# Patient Record
Sex: Male | Born: 1965 | Hispanic: Yes | Marital: Married | Smoking: Never smoker
Health system: Southern US, Community
[De-identification: ages and names within clinical notes are randomized; demographics above are authoritative.]

## PROBLEM LIST (undated history)

## (undated) DIAGNOSIS — E119 Type 2 diabetes mellitus without complications: Secondary | ICD-10-CM

## (undated) DIAGNOSIS — I10 Essential (primary) hypertension: Secondary | ICD-10-CM

## (undated) HISTORY — PX: VASCULAR SURGERY: SHX849

## (undated) HISTORY — PX: CHOLECYSTECTOMY: SHX55

---

## 2020-03-13 ENCOUNTER — Encounter (HOSPITAL_BASED_OUTPATIENT_CLINIC_OR_DEPARTMENT_OTHER): Payer: Self-pay

## 2020-03-13 ENCOUNTER — Other Ambulatory Visit: Payer: Self-pay

## 2020-03-13 ENCOUNTER — Emergency Department (HOSPITAL_BASED_OUTPATIENT_CLINIC_OR_DEPARTMENT_OTHER): Payer: 59

## 2020-03-13 ENCOUNTER — Telehealth (HOSPITAL_BASED_OUTPATIENT_CLINIC_OR_DEPARTMENT_OTHER): Payer: Self-pay | Admitting: Physician Assistant

## 2020-03-13 ENCOUNTER — Emergency Department (HOSPITAL_BASED_OUTPATIENT_CLINIC_OR_DEPARTMENT_OTHER)
Admission: EM | Admit: 2020-03-13 | Discharge: 2020-03-13 | Disposition: A | Discharge status: Home / Self Care | Payer: 59 | Attending: Emergency Medicine | Admitting: Emergency Medicine

## 2020-03-13 DIAGNOSIS — E119 Type 2 diabetes mellitus without complications: Secondary | ICD-10-CM | POA: Diagnosis not present

## 2020-03-13 DIAGNOSIS — R1031 Right lower quadrant pain: Secondary | ICD-10-CM | POA: Diagnosis present

## 2020-03-13 DIAGNOSIS — I10 Essential (primary) hypertension: Secondary | ICD-10-CM | POA: Insufficient documentation

## 2020-03-13 DIAGNOSIS — N201 Calculus of ureter: Secondary | ICD-10-CM

## 2020-03-13 HISTORY — DX: Essential (primary) hypertension: I10

## 2020-03-13 HISTORY — DX: Type 2 diabetes mellitus without complications: E11.9

## 2020-03-13 LAB — COMPREHENSIVE METABOLIC PANEL
ALT: 56 U/L — ABNORMAL HIGH (ref 0–44)
AST: 39 U/L (ref 15–41)
Albumin: 4.4 g/dL (ref 3.5–5.0)
Alkaline Phosphatase: 62 U/L (ref 38–126)
Anion gap: 11 (ref 5–15)
BUN: 21 mg/dL — ABNORMAL HIGH (ref 6–20)
CO2: 24 mmol/L (ref 22–32)
Calcium: 9.4 mg/dL (ref 8.9–10.3)
Chloride: 104 mmol/L (ref 98–111)
Creatinine, Ser: 0.98 mg/dL (ref 0.61–1.24)
GFR calc Af Amer: >60 mL/min (ref >60)
GFR calc non Af Amer: >60 mL/min (ref >60)
Glucose, Bld: 143 mg/dL — ABNORMAL HIGH (ref 70–99)
Potassium: 3.9 mmol/L (ref 3.5–5.1)
Sodium: 139 mmol/L (ref 135–145)
Total Bilirubin: 0.7 mg/dL (ref 0.3–1.2)
Total Protein: 7.7 g/dL (ref 6.5–8.1)

## 2020-03-13 LAB — CBC WITH DIFFERENTIAL/PLATELET
Abs Immature Granulocytes: 0.05 10*3/uL (ref 0.00–0.07)
Basophils Absolute: 0.1 10*3/uL (ref 0.0–0.1)
Basophils Relative: 1 %
Eosinophils Absolute: 0 10*3/uL (ref 0.0–0.5)
Eosinophils Relative: 0 %
HCT: 51.4 % (ref 39.0–52.0)
Hemoglobin: 17 g/dL (ref 13.0–17.0)
Immature Granulocytes: 0 %
Lymphocytes Relative: 5 %
Lymphs Abs: 0.7 10*3/uL (ref 0.7–4.0)
MCH: 32 pg (ref 26.0–34.0)
MCHC: 33.1 g/dL (ref 30.0–36.0)
MCV: 96.6 fL (ref 80.0–100.0)
Monocytes Absolute: 0.5 10*3/uL (ref 0.1–1.0)
Monocytes Relative: 3 %
Neutro Abs: 13.2 10*3/uL — ABNORMAL HIGH (ref 1.7–7.7)
Neutrophils Relative %: 91 %
Platelets: 195 10*3/uL (ref 150–400)
RBC: 5.32 MIL/uL (ref 4.22–5.81)
RDW: 12.9 % (ref 11.5–15.5)
WBC: 14.5 10*3/uL — ABNORMAL HIGH (ref 4.0–10.5)
nRBC: 0 % (ref 0.0–0.2)

## 2020-03-13 LAB — URINALYSIS, MICROSCOPIC (REFLEX)

## 2020-03-13 LAB — URINALYSIS, ROUTINE W REFLEX MICROSCOPIC
Bilirubin Urine: NEGATIVE
Glucose, UA: NEGATIVE mg/dL
Ketones, ur: NEGATIVE mg/dL
Leukocytes,Ua: NEGATIVE
Nitrite: NEGATIVE
Protein, ur: NEGATIVE mg/dL
Specific Gravity, Urine: 1.02 (ref 1.005–1.030)
pH: 6 (ref 5.0–8.0)

## 2020-03-13 LAB — CBG MONITORING, ED: Glucose-Capillary: 131 mg/dL — ABNORMAL HIGH (ref 70–99)

## 2020-03-13 LAB — LIPASE, BLOOD: Lipase: 22 U/L (ref 11–51)

## 2020-03-13 MED ORDER — TAMSULOSIN HCL 0.4 MG PO CAPS: 0.4000 mg | ORAL_CAPSULE | Freq: Every day | ORAL | 0 refills | Status: DC

## 2020-03-13 MED ORDER — ONDANSETRON HCL 4 MG PO TABS
4.0000 mg | ORAL_TABLET | Freq: Four times a day (QID) | ORAL | 0 refills | Status: DC
Start: 2020-03-13 — End: 2020-03-13

## 2020-03-13 MED ORDER — HYDROCODONE-ACETAMINOPHEN 5-325 MG PO TABS: 1.0000 | ORAL_TABLET | ORAL | 0 refills | Status: DC | PRN

## 2020-03-13 MED ORDER — ONDANSETRON HCL 4 MG PO TABS: 4.0000 mg | ORAL_TABLET | Freq: Four times a day (QID) | ORAL | 0 refills | Status: AC

## 2020-03-13 MED ORDER — ONDANSETRON HCL 4 MG/2ML IJ SOLN
4.0000 mg | Freq: Once | INTRAMUSCULAR | Status: AC
  Administered 2020-03-13: 4 mg via INTRAVENOUS
  Filled 2020-03-13: qty 2

## 2020-03-13 MED ORDER — SODIUM CHLORIDE 0.9 % IV BOLUS
1000.0000 mL | Freq: Once | INTRAVENOUS | Status: AC
  Administered 2020-03-13: 1000 mL via INTRAVENOUS

## 2020-03-13 NOTE — ED Notes (Signed)
ED Provider at bedside. 

## 2020-03-13 NOTE — ED Provider Notes (Signed)
[facility] EMERGENCY DEPARTMENT Provider Note   CSN: [id number] Arrival date & time: 03/13/20  1015     History Chief Complaint  Patient presents with  . Abdominal Pain    Shannon Hampton is a 54 y.o. male.  HPI 54 year old male w/ history of pretension and DM type II presents to the ER for fairly sudden onset of right lower quadrant pain around 3:30 AM this morning.  Pain has been gradually increasing and he has had consistent nausea and several episodes of nonbloody nonbilious vomiting.  He denies any fevers but does endorse subjective chills.  He had normal bowel movements, last bowel movement was yesterday.  He does state that he has a history of constipation approximately 3 months ago but this is cleared with some medications that he was given from his primary care doctor.  He denies any dysuria or blood in his urine.  No chest pain or shortness of breath.  No headache.  No back pain.  He was seen in urgent care and was given Toradol, which brought his pain from a 10 down to a 4.  He was sent here for rule out of appendicitis or kidney stones.  He denies any chest pain or shortness of breath.  History of kidney stones, appendix present.    Past Medical History:  Diagnosis Date  . Diabetes mellitus without complication (HCC)   . Hypertension     There are no problems to display for this patient.   Past Surgical History:  Procedure Laterality Date  . CHOLECYSTECTOMY    . VASCULAR SURGERY         No family history on file.  Social History   Tobacco Use  . Smoking status: Never Smoker  . Smokeless tobacco: Never Used  Substance Use Topics  . Alcohol use: Never  . Drug use: Never    Home Medications Prior to Admission medications   Medication Sig Start Date End Date Taking? Authorizing Provider  HYDROcodone-acetaminophen (NORCO/VICODIN) 5-325 MG tablet Take 1 tablet by mouth every 4 (four) hours as needed for up to 4 days. 03/13/20 03/17/20  Mare Ferrari, PA-C  ondansetron (ZOFRAN) 4 MG tablet Take 1 tablet (4 mg total) by mouth every 6 (six) hours. 03/13/20   Mare Ferrari, PA-C  tamsulosin (FLOMAX) 0.4 MG CAPS capsule Take 1 capsule (0.4 mg total) by mouth daily. 03/13/20   Mare Ferrari, PA-C    Allergies    Patient has no known allergies.  Review of Systems   Review of Systems  Constitutional: Positive for chills. Negative for fever.  HENT: Negative for ear pain and sore throat.   Eyes: Negative for pain and visual disturbance.  Respiratory: Negative for cough and shortness of breath.   Cardiovascular: Negative for chest pain and palpitations.  Gastrointestinal: Positive for abdominal pain, nausea and vomiting.  Genitourinary: Negative for dysuria and hematuria.  Musculoskeletal: Negative for arthralgias and back pain.  Skin: Negative for color change and rash.  Neurological: Negative for dizziness, seizures, syncope and headaches.  Psychiatric/Behavioral: Negative for confusion.  All other systems reviewed and are negative.   Physical Exam Updated Vital Signs BP (!) 153/100 (BP Location: Right Arm)   Pulse 66   Temp (!) 97.5 F (36.4 C) (Oral)   Resp 18   Ht 5\' 8"  (1.727 m)   Wt 90.7 kg   SpO2 97%   BMI 30.41 kg/m   Physical Exam Vitals and nursing note reviewed.  Constitutional:  General: He is not in acute distress.    Appearance: He is well-developed. He is not ill-appearing or diaphoretic.  HENT:     Head: Normocephalic and atraumatic.  Eyes:     Conjunctiva/sclera: Conjunctivae normal.  Cardiovascular:     Rate and Rhythm: Normal rate and regular rhythm.     Heart sounds: Normal heart sounds. No murmur heard.   Pulmonary:     Effort: Pulmonary effort is normal. No respiratory distress.     Breath sounds: Normal breath sounds.  Abdominal:     Palpations: Abdomen is soft.     Tenderness: There is abdominal tenderness in the right lower quadrant. There is right CVA tenderness. There is no  left CVA tenderness or rebound. Positive signs include McBurney's sign. Negative signs include Murphy's sign.  Musculoskeletal:     Cervical back: Neck supple.  Skin:    General: Skin is warm and dry.     Coloration: Skin is not jaundiced or mottled.  Neurological:     General: No focal deficit present.     Mental Status: He is alert.  Psychiatric:        Mood and Affect: Mood normal.        Behavior: Behavior normal.     ED Results / Procedures / Treatments   Labs (all labs ordered are listed, but only abnormal results are displayed) Labs Reviewed  CBC WITH DIFFERENTIAL/PLATELET - Abnormal; Notable for the following components:      Result Value   WBC 14.5 (*)    Neutro Abs 13.2 (*)    All other components within normal limits  URINALYSIS, ROUTINE W REFLEX MICROSCOPIC - Abnormal; Notable for the following components:   Hgb urine dipstick LARGE (*)    All other components within normal limits  COMPREHENSIVE METABOLIC PANEL - Abnormal; Notable for the following components:   Glucose, Bld 143 (*)    BUN 21 (*)    ALT 56 (*)    All other components within normal limits  URINALYSIS, MICROSCOPIC (REFLEX) - Abnormal; Notable for the following components:   Bacteria, UA MANY (*)    All other components within normal limits  CBG MONITORING, ED - Abnormal; Notable for the following components:   Glucose-Capillary 131 (*)    All other components within normal limits  URINE CULTURE  LIPASE, BLOOD    EKG EKG Interpretation  Date/Time:  Friday March 13 2020 10:53:42 EDT Ventricular Rate:  62 PR Interval:    QRS Duration: 102 QT Interval:  395 QTC Calculation: 402 R Axis:   96 Text Interpretation: Sinus rhythm Borderline right axis deviation ST elev, probable normal early repol pattern No previous ECGs available Confirmed by Frederick Peers [phone]) on 03/13/2020 10:56:59 AM   Radiology CT Renal Stone Study  Result Date: 03/13/2020 CLINICAL DATA:  Right flank pain and right  lower quadrant pain with nausea and vomiting EXAM: CT ABDOMEN AND PELVIS WITHOUT CONTRAST TECHNIQUE: Multidetector CT imaging of the abdomen and pelvis was performed following the standard protocol without IV contrast. COMPARISON:  None. FINDINGS: Lower chest: Bibasilar subsegmental atelectasis. Heart size is normal. Hepatobiliary: No focal liver abnormality is seen. Status post cholecystectomy. No biliary dilatation. Pancreas: Punctate parenchymal calcifications suggesting sequela of chronic pancreatitis. No pancreatic ductal dilatation or surrounding inflammatory changes. No peripancreatic fluid collection. Spleen: Normal in size without focal abnormality. Adrenals/Urinary Tract: Unremarkable adrenal glands. Numerous coarse calcifications within the right kidney measuring between 2-4 mm. 4 mm stone within the mid right ureter resulting  in mild right hydronephrosis (series 2, image 55) there are numerous coarse calcifications within the left kidney, the largest measuring up to 7 mm. No left-sided hydronephrosis. Left ureter is unremarkable. Urinary bladder is unremarkable. Stomach/Bowel: Stomach is within normal limits. Appendix appears normal (series 2, image 58). Scattered colonic diverticulosis. No evidence of bowel wall thickening, distention, or inflammatory changes. Moderate volume of stool within the colon. Vascular/Lymphatic: No significant vascular findings are present. No enlarged abdominal or pelvic lymph nodes. Reproductive: Prostate is unremarkable. Other: Small fat containing umbilical hernia. No free air, free fluid, or fluid collection. Musculoskeletal: 9 mm rounded area of sclerosis in the posterior left acetabulum (series 2, image 84) nonspecific and may reflect a bone island. No additional lytic or sclerotic bone lesions. No acute osseous abnormality. 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 IMPRESSION: 1. There is a 4 mm stone within the mid right  ureter resulting in mild right hydronephrosis. 2. Multiple bilateral nonobstructing renal calculi. 3. Colonic diverticulosis without evidence of acute diverticulitis. 4. Small fat containing umbilical hernia. 5. Grade 1 anterolisthesis of L5 on S1 secondary to chronic appearing bilateral L5 pars defects. 6. Incidental note of a 9 mm rounded area of sclerosis in the posterior left acetabulum, nonspecific. In the absence of known malignancy, this finding is favored to reflect a bone island. Electronically Signed   By: Davina Poke D.O.   On: 03/13/2020 13:24    Procedures Procedures (including critical care time)  Medications Ordered in ED Medications  ondansetron (ZOFRAN) injection 4 mg (4 mg Intravenous Given 03/13/20 1105)  sodium chloride 0.9 % bolus 1,000 mL (0 mLs Intravenous Stopped 03/13/20 1216)    ED Course  I have reviewed the triage vital signs and the nursing notes.  Pertinent labs & imaging results that were available during my care of the patient were reviewed by me and considered in my medical decision making (see chart for details).    MDM Rules/Calculators/A&P                         54 year old male with right lower quadrant pain which began earlier this morning around 3:30 AM On presentation, the patient is alert and oriented, nontoxic-appearing, no acute distress.  He is resting comfortably in the ER bed without evidence of increased work of breathing.  Physical exam with right lower quadrant tenderness and right flank tenderness.  No other pertinent physical exam findings.  Patient is afebrile here in the ED, slightly hypertensive with a blood pressure 153/100, but no signs of hypertensive urgency/emergency.  Suspect this is likely due to pain.  DDx includes kidney stones, appendicitis, diverticulitis, bowel obstruction, diverticulitis, inguinal hernia  CBC with leukocytosis of 14.2, UA, CMP and lipase pending.  CBG pending.  Will evaluate UA and determine appropriate  imaging.  Will give IV fluids and Zofran.  Patient received Toradol in the ER and notes improvement in his pain.  Will provide pain medication if pain returns again.   CMP with mildly increased BUN of 21, normal creatinine.  Normal electrolytes, ALT mildly elevated at 56.  No other values to compare this with.  UA with large hemoglobin, many bacteria.  Patient denies any pain or burning with urination, will send off culture but doubt UTI.  CBC with leukocytosis of 14.5.  CBG 131 today.  UA renal study with right ureteral stone causing mild hydronephrosis.  A rounded area of sclerosis was noted  on the left acetabulum, patient does not have a history of cancer so doubt malignancy.  I did mention this to the patient and encouraged him to follow-up with his PCP about this finding.  Strict return precautions given, 9 including worsening fever, chills, nausea, vomiting, uncontrolled pain.  Follow-up for urology provided.  Zofran, Flomax, strainer and pain medication provided.  All the patient's questions have been answered, he voices understanding and is agreeable to the plan.  Discussed the case with Dr. Clarene Duke who is agreeable to the above plan and disposition.   Final Clinical Impression(s) / ED Diagnoses Final diagnoses:  Right ureteral stone    Rx / DC Orders ED Discharge Orders         Ordered    ondansetron (ZOFRAN) 4 MG tablet  Every 6 hours,   Status:  Discontinued     Reprint     03/13/20 1346    tamsulosin (FLOMAX) 0.4 MG CAPS capsule  Daily,   Status:  Discontinued     Reprint     03/13/20 1346    HYDROcodone-acetaminophen (NORCO/VICODIN) 5-325 MG tablet  Every 4 hours PRN,   Status:  Discontinued     Reprint     03/13/20 1346    HYDROcodone-acetaminophen (NORCO/VICODIN) 5-325 MG tablet  Every 4 hours PRN     Discontinue  Reprint     03/13/20 1351    ondansetron (ZOFRAN) 4 MG tablet  Every 6 hours     Discontinue  Reprint     03/13/20 1351    tamsulosin (FLOMAX) 0.4 MG CAPS capsule   Daily     Discontinue  Reprint     03/13/20 1351           Mare Ferrari, PA-C 03/13/20 1359    Little, Ambrose Finland, MD 03/14/20 1657

## 2020-03-13 NOTE — Discharge Instructions (Addendum)
Your scan today showed a kidney stone in your right ureter.  You also have some stones in the kidney as well.  There was also a spot noted on the bone in your  left hip, please follow-up with your primary care doctor about this.  I have provided some pain medication, nausea medication and a medication called Flomax which will help with getting the stone out.  Please return to the ER if you have fevers, chills, worsening pain, or any other concerning symptoms.  Please call the urology office provided to schedule an appointment for follow-up.

## 2020-03-13 NOTE — Telephone Encounter (Cosign Needed)
Vicodin/Zofran/Flomax did not go through to pharmacy. Will re-order.

## 2020-03-13 NOTE — ED Notes (Signed)
Pt reports he is taking medications for BP, DM, and HLD, unsure of names of medications.

## 2020-03-13 NOTE — ED Triage Notes (Addendum)
Pt arrives with c/o abdominal pain starting around 330 this morning. Pt was seen at [facility] and given Toradol injection. Pt reports this brought his pain from a 10 to a 4. Pain is sharp, c/o nausea around 6 am. Pt points to right flank and RLQ when asked where he is having pain. Denies urinary symptoms.

## 2020-03-13 NOTE — ED Notes (Signed)
Taken to CT.

## 2020-03-14 ENCOUNTER — Telehealth: Payer: Self-pay

## 2020-03-14 LAB — URINE CULTURE: Culture: NO GROWTH

## 2020-03-14 MED ORDER — ONDANSETRON 4 MG PO TBDP
4.0000 mg | ORAL_TABLET | Freq: Three times a day (TID) | ORAL | 0 refills | Status: AC | PRN
Start: 2020-03-14 — End: ?

## 2020-03-14 MED ORDER — HYDROCODONE-ACETAMINOPHEN 5-325 MG PO TABS: 2.0000 | ORAL_TABLET | ORAL | 0 refills | Status: AC | PRN

## 2020-03-14 MED ORDER — TAMSULOSIN HCL 0.4 MG PO CAPS: 0.4000 mg | ORAL_CAPSULE | Freq: Every day | ORAL | 0 refills | Status: AC

## 2020-03-14 NOTE — Telephone Encounter (Signed)
Prescriptions not received by pharmacy. I resent original medications.

## 2020-03-14 NOTE — Telephone Encounter (Signed)
Narcotic orders for [facility] can only go up to 10 ills per day unless he was a inpatient. The order reads up to 12 pills a day. Messaged Dr Clarene Duke who oked 10 pills a day 2 tabs every 4 hours with a cap of 10 pills a day.  Conveyed this to pharmacist.

## 2020-12-10 IMAGING — CT CT RENAL STONE PROTOCOL
2 of 4 series · 16 of 46 positions shown, 18 images · non-contrast
Comparison: None.

CLINICAL DATA: Right flank pain and right lower quadrant pain with
nausea and vomiting

EXAM:
CT ABDOMEN AND PELVIS WITHOUT CONTRAST
TECHNIQUE: Multidetector CT imaging of the abdomen and pelvis was performed
following the standard protocol without IV contrast.

[Series 2: axial st · axial · 0.91mm/px · z∈[-512,-37]mm · 13 of 105 slices shown, 15 images]
[im 5/105  soft-tissue]
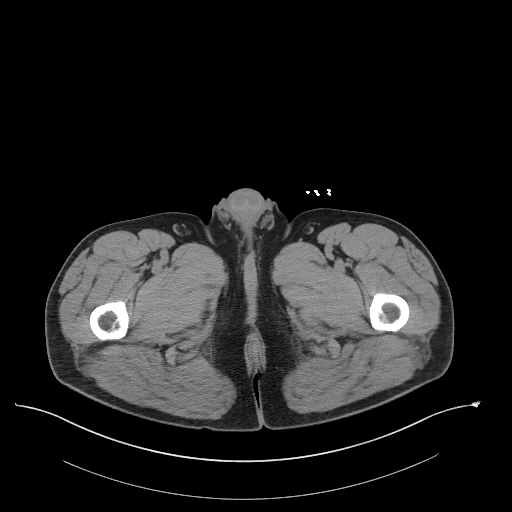
[im 5/105  bone]
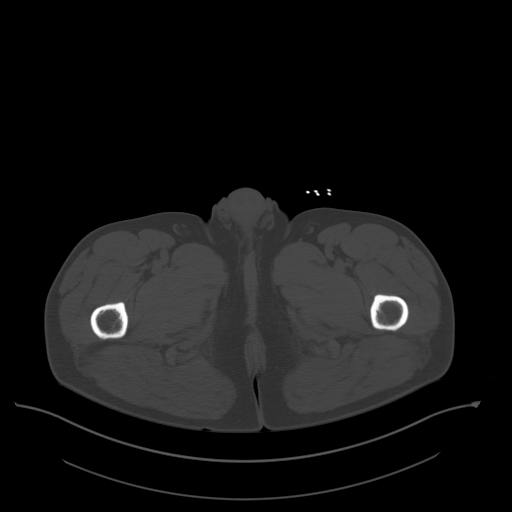
[im 14/105  soft-tissue]
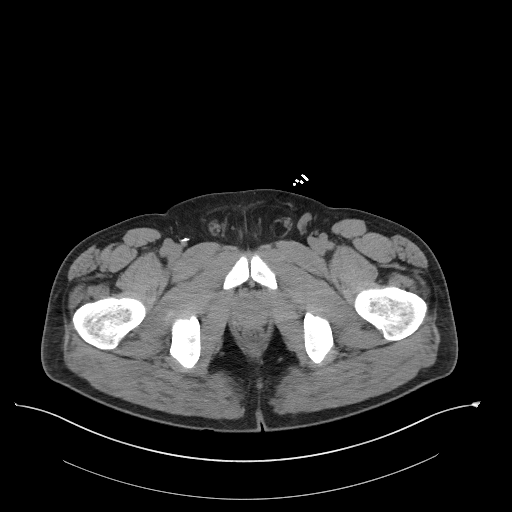
[im 22/105  soft-tissue]
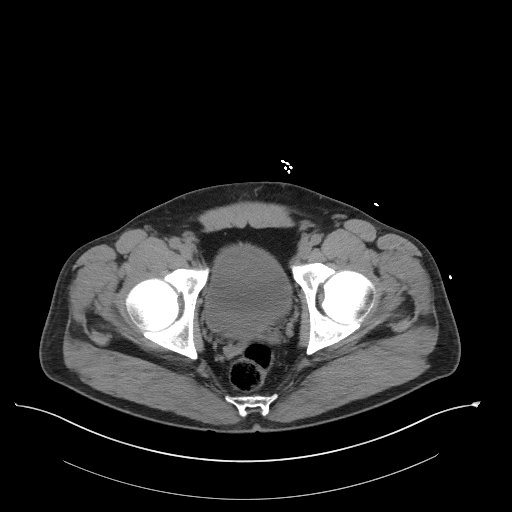
[im 31/105  soft-tissue]
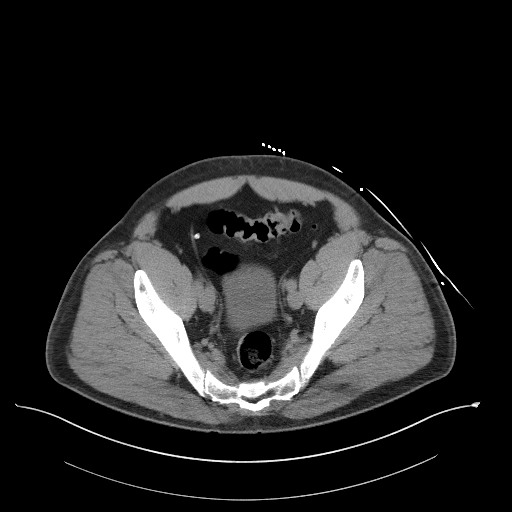
[im 35/105  soft-tissue]
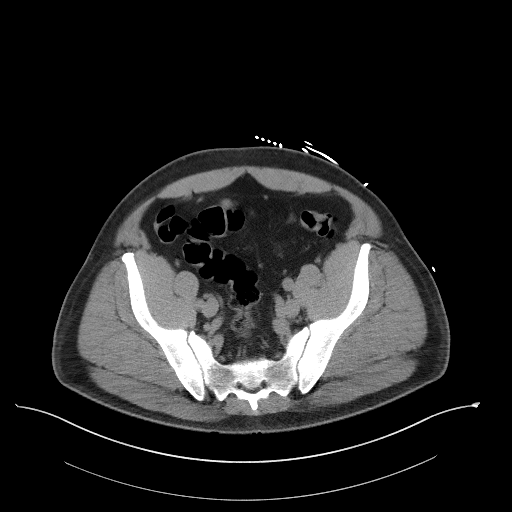
[im 44/105  soft-tissue]
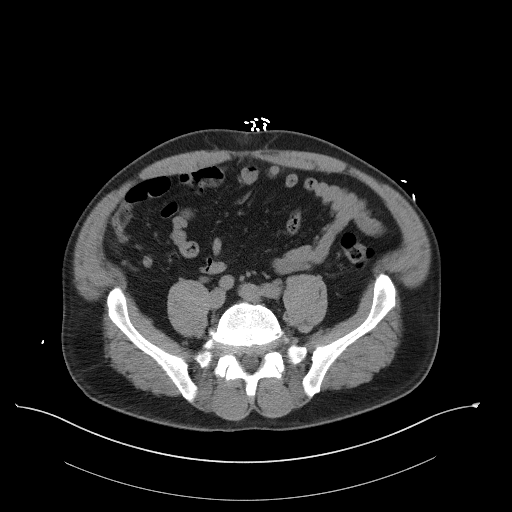
[im 53/105  soft-tissue]
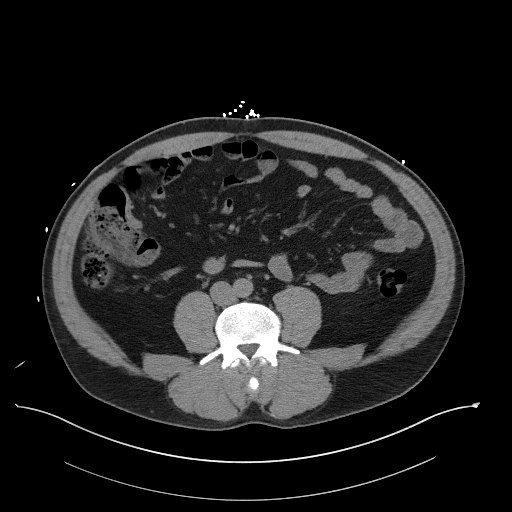
[im 61/105  soft-tissue]
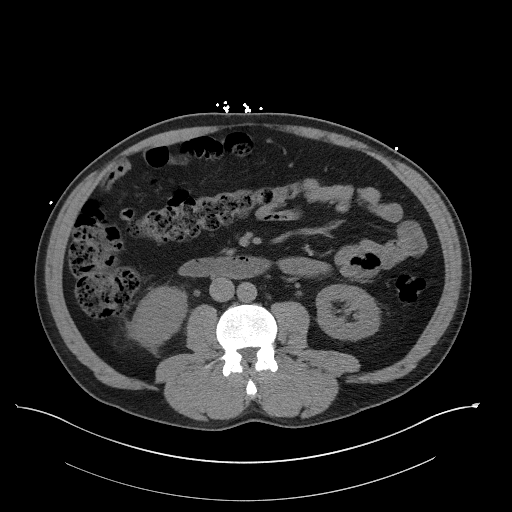
[im 70/105  soft-tissue]
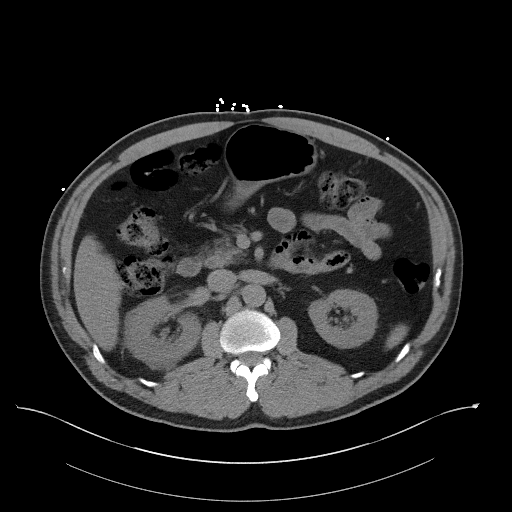
[im 70/105  bone]
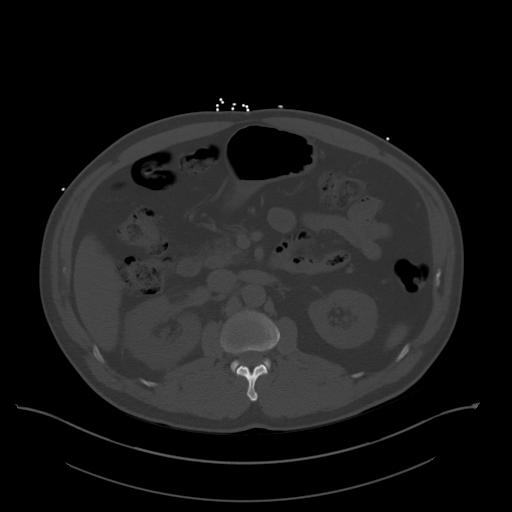
[im 74/105  soft-tissue]
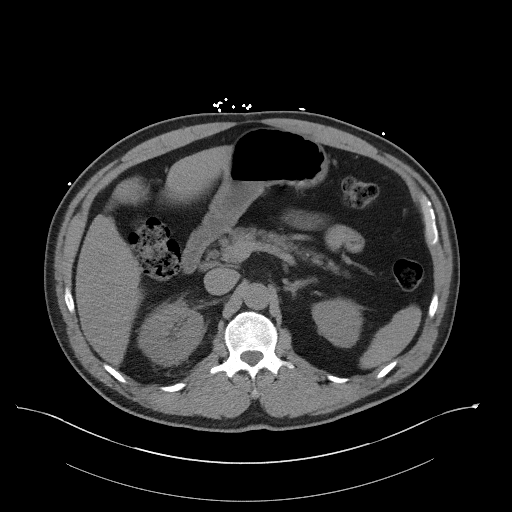
[im 83/105  soft-tissue]
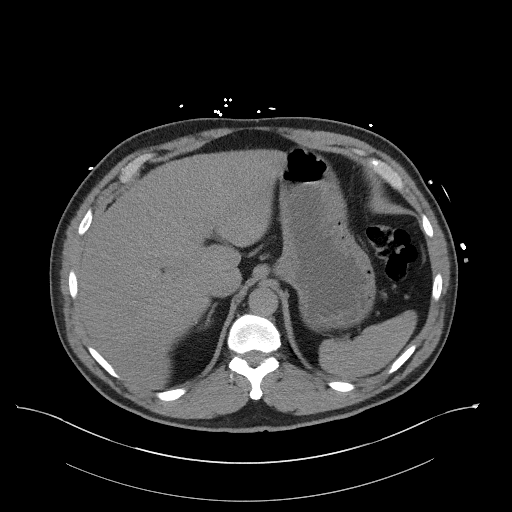
[im 92/105  soft-tissue]
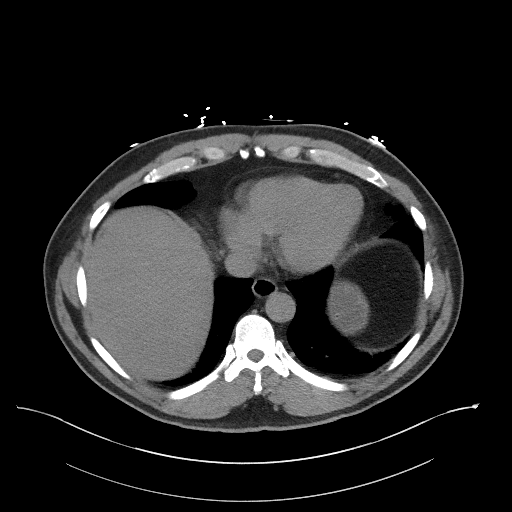
[im 100/105  soft-tissue]
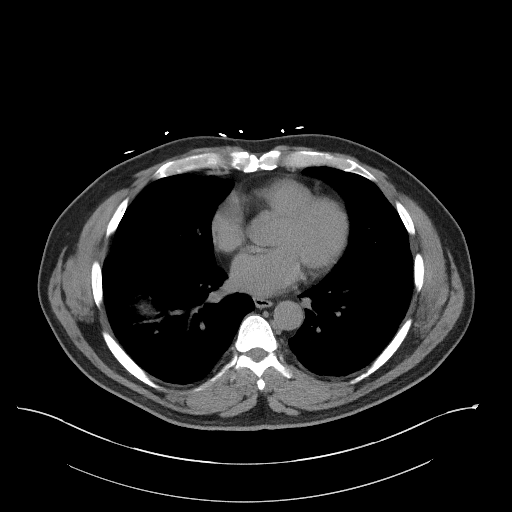

[Series 5: coronal st · coronal · 0.98mm/px · 3 of 103 slices shown]
[im 35/103  soft-tissue]
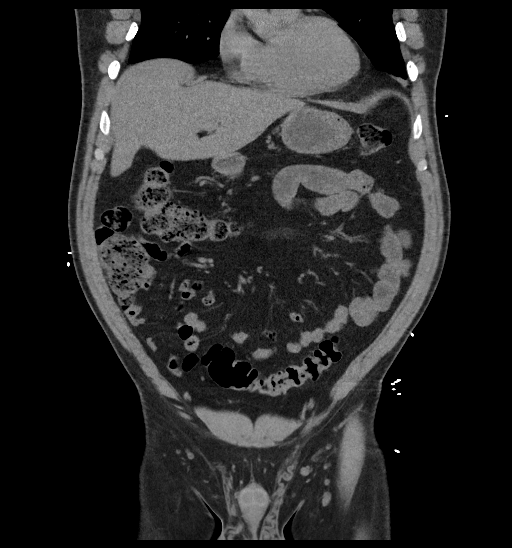
[im 46/103  soft-tissue]
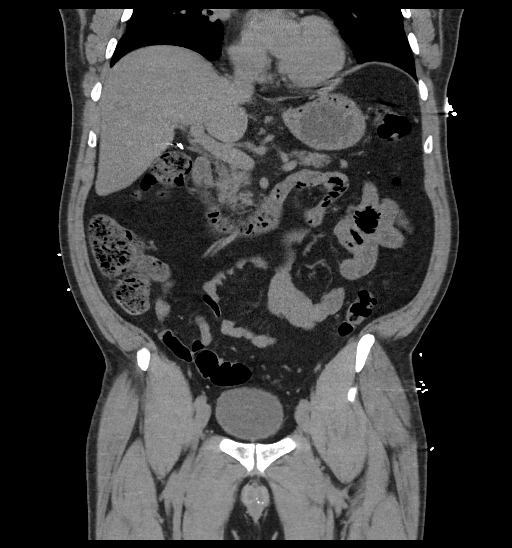
[im 57/103  soft-tissue]
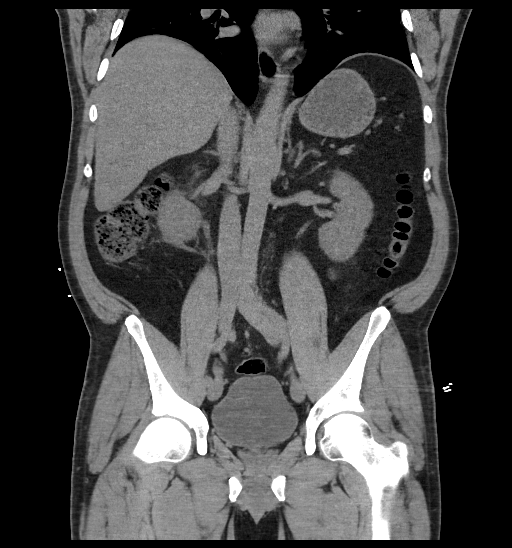

[16 of 46 positions shown; findings below may reference images not displayed]

FINDINGS: Lower chest: Bibasilar subsegmental atelectasis. Heart size is
normal.

Hepatobiliary: No focal liver abnormality is seen. Status post
cholecystectomy. No biliary dilatation.

Pancreas: Punctate parenchymal calcifications suggesting sequela of
chronic pancreatitis. No pancreatic ductal dilatation or surrounding
inflammatory changes. No peripancreatic fluid collection.

Spleen: Normal in size without focal abnormality.

Adrenals/Urinary Tract: Unremarkable adrenal glands. Numerous coarse
calcifications within the right kidney measuring between 2-4 mm. 4
mm stone within the mid right ureter resulting in mild right
hydronephrosis (series 2, image 55) there are numerous coarse
calcifications within the left kidney, the largest measuring up to 7
mm. No left-sided hydronephrosis. Left ureter is unremarkable.
Urinary bladder is unremarkable.

Stomach/Bowel: Stomach is within normal limits. Appendix appears
normal (series 2, image 58). Scattered colonic diverticulosis. No
evidence of bowel wall thickening, distention, or inflammatory
changes. Moderate volume of stool within the colon.

Vascular/Lymphatic: No significant vascular findings are present. No
enlarged abdominal or pelvic lymph nodes.

Reproductive: Prostate is unremarkable.

Other: Small fat containing umbilical hernia. No free air, free
fluid, or fluid collection.

Musculoskeletal: 9 mm rounded area of sclerosis in the posterior
left acetabulum (series 2, image 84) nonspecific and may reflect a
bone island. No additional lytic or sclerotic bone lesions. No acute
osseous abnormality. Grade 1 anterolisthesis of L5 on S1 secondary
to chronic bilateral L5 pars defects.
IMPRESSION: 1. There is a 4 mm stone within the mid right ureter resulting in
mild right hydronephrosis.
2. Multiple bilateral nonobstructing renal calculi.
3. Colonic diverticulosis without evidence of acute diverticulitis.
4. Small fat containing umbilical hernia.
5. Grade 1 anterolisthesis of L5 on S1 secondary to chronic
appearing bilateral L5 pars defects.
6. Incidental note of a 9 mm rounded area of sclerosis in the
posterior left acetabulum, nonspecific. In the absence of known
malignancy, this finding is favored to reflect a bone island.
